# Patient Record
Sex: Female | Born: 1995 | Race: Black or African American | Hispanic: No | Marital: Married | Smoking: Never smoker
Health system: Southern US, Community
[De-identification: ages and names within clinical notes are randomized; demographics above are authoritative.]

## PROBLEM LIST (undated history)

## (undated) HISTORY — PX: TONSILLECTOMY: SUR1361

## (undated) HISTORY — PX: OTHER SURGICAL HISTORY: SHX169

---

## 2016-05-05 ENCOUNTER — Emergency Department (HOSPITAL_COMMUNITY)
Admission: EM | Admit: 2016-05-05 | Discharge: 2016-05-06 | Disposition: A | Discharge status: Home / Self Care | Payer: Self-pay | Attending: Emergency Medicine | Admitting: Emergency Medicine

## 2016-05-05 ENCOUNTER — Encounter (HOSPITAL_COMMUNITY): Payer: Self-pay | Admitting: Emergency Medicine

## 2016-05-05 DIAGNOSIS — R42 Dizziness and giddiness: Secondary | ICD-10-CM | POA: Insufficient documentation

## 2016-05-05 DIAGNOSIS — F0781 Postconcussional syndrome: Secondary | ICD-10-CM | POA: Insufficient documentation

## 2016-05-05 LAB — POC URINE PREG, ED: PREG TEST UR: NEGATIVE

## 2016-05-05 NOTE — ED Notes (Signed)
Bed: WA04 Expected date:  Expected time:  Means of arrival:  Comments: EMS 20 yo female dizziness/nystagmus-syncopal episode yesterday

## 2016-05-05 NOTE — ED Triage Notes (Signed)
Pt reports dizziness and headache that has been intermittent since hitting head on dresser on 05/04/16. Pt alert and oriented x 4 at this time. Pt reports having brief LOC yesterday after incident unknown of exact length of time.

## 2016-05-06 ENCOUNTER — Emergency Department (HOSPITAL_COMMUNITY): Payer: Self-pay

## 2016-05-06 LAB — URINALYSIS, ROUTINE W REFLEX MICROSCOPIC
Bilirubin Urine: NEGATIVE
Glucose, UA: NEGATIVE mg/dL
Hgb urine dipstick: NEGATIVE
Ketones, ur: NEGATIVE mg/dL
LEUKOCYTES UA: NEGATIVE
NITRITE: NEGATIVE
PROTEIN: NEGATIVE mg/dL
SPECIFIC GRAVITY, URINE: 1.03 (ref 1.005–1.030)
pH: 6.5 (ref 5.0–8.0)

## 2016-05-06 MED ORDER — IBUPROFEN 800 MG PO TABS: 800.0000 mg | ORAL_TABLET | Freq: Three times a day (TID) | ORAL | 0 refills | Status: DC

## 2016-05-06 NOTE — ED Provider Notes (Signed)
[facility]-EMERGENCY DEPT Provider Note   CSN: [id number] Arrival date & time: 05/05/16  2335     History   Chief Complaint Chief Complaint  Patient presents with  . Dizziness    HPI Michelle Preston is a 20 y.o. female.  The history is provided by the patient.  Dizziness  Quality:  Lightheadedness Severity:  Moderate Onset quality:  Sudden Duration:  1 day Timing:  Constant Progression:  Unchanged Chronicity:  New Context: not with medication   Relieved by:  Nothing Worsened by:  Nothing Ineffective treatments:  None tried Associated symptoms: no tinnitus, no vision changes, no vomiting and no weakness   Risk factors: no hx of vertigo   had a fall yesterday and struck the back of her head on a dresser and has felt lightheaded ever since.  No weakness.  No changes in vision or speech.    History reviewed. No pertinent past medical history.  There are no active problems to display for this patient.   Past Surgical History:  Procedure Laterality Date  . adnoidectomy    . CESAREAN SECTION  10/24/2013  . TONSILLECTOMY      OB History    No data available       Home Medications    Prior to Admission medications   Not on File    Family History History reviewed. No pertinent family history.  Social History Social History  Substance Use Topics  . Smoking status: Never Smoker  . Smokeless tobacco: Never Used  . Alcohol use No     Allergies   Penicillins   Review of Systems Review of Systems  Constitutional: Negative for diaphoresis and fever.  HENT: Negative for tinnitus.   Eyes: Negative for photophobia.  Gastrointestinal: Negative for vomiting.  Neurological: Positive for dizziness and light-headedness. Negative for tremors, seizures, speech difficulty and weakness.  All other systems reviewed and are negative.    Physical Exam Updated Vital Signs BP 131/84 (BP Location: Left Arm)   Pulse 89   Temp 98.6 F (37 C) (Oral)   Resp 20   Ht 5'  2" (1.575 m)   Wt 206 lb (93.4 kg)   SpO2 99%   BMI 37.68 kg/m   Physical Exam  Constitutional: She is oriented to person, place, and time. She appears well-developed and well-nourished. No distress.  HENT:  Head: Normocephalic and atraumatic. Head is without raccoon's eyes and without Battle's sign.  Right Ear: External ear normal. No hemotympanum.  Left Ear: External ear normal. No hemotympanum.  Mouth/Throat: No oropharyngeal exudate.  Eyes: Conjunctivae and EOM are normal. Pupils are equal, round, and reactive to light.  Neck: Normal range of motion. Neck supple.  Cardiovascular: Normal rate, regular rhythm, normal heart sounds and intact distal pulses.   Pulmonary/Chest: Effort normal and breath sounds normal. No respiratory distress.  Abdominal: Soft. Bowel sounds are normal. She exhibits no mass. There is no tenderness. There is no rebound and no guarding.  Musculoskeletal: Normal range of motion.  Neurological: She is alert and oriented to person, place, and time. She has normal reflexes. No cranial nerve deficit. Coordination normal.  Skin: Skin is warm and dry. Capillary refill takes less than 2 seconds.  Psychiatric: She has a normal mood and affect.     ED Treatments / Results  Labs (all labs ordered are listed, but only abnormal results are displayed) Labs Reviewed  URINALYSIS, ROUTINE W REFLEX MICROSCOPIC (NOT AT [facility])  POC URINE PREG, ED    EKG  EKG Interpretation None       Radiology No results found.  Procedures Procedures (including critical care time)  Medications Ordered in ED Medications - No data to display   Initial Impression / Assessment and Plan / ED Course  I have reviewed the triage vital signs and the nursing notes.  Pertinent labs & imaging results that were available during my care of the patient were reviewed by me and considered in my medical decision making (see chart for details).  Clinical Course   Vitals:   05/05/16 2338    BP: 131/84  Pulse: 89  Resp: 20  Temp: 98.6 F (37 C)   Orthostatic VS for the past 24 hrs:  BP- Lying Pulse- Lying BP- Sitting Pulse- Sitting BP- Standing at 0 minutes Pulse- Standing at 0 minutes  05/06/16 0009 (!) 128/96 76 (!) 139/99 86 130/85 87        Final Clinical Impressions(s) / ED Diagnoses   Final diagnoses:  None   Post concussive syndrome.  Alternating tylenol and ibuprofen, no sports limit screen time to < 30 minutes a day.  Lots of water and good balanced diet.  All questions answered to patient's satisfaction. Based on history and exam patient has been appropriately medically screened and emergency conditions excluded. Patient is stable for discharge at this time. Follow up with your PMD for recheck in 2 days and strict return precautions given.  New Prescriptions New Prescriptions   No medications on file     Cathy Crounse, MD 05/06/16 0151

## 2017-05-06 IMAGING — CT CT HEAD W/O CM
3 of 4 series · 15 of 47 positions shown, 18 images · non-contrast
Comparison: None.

CLINICAL DATA: 20-year-old female with intermittent headache and
dizziness.

EXAM:
CT HEAD WITHOUT CONTRAST
TECHNIQUE: Contiguous axial images were obtained from the base of the skull
through the vertex without intravenous contrast.

[Series 2: head w/o · axial · non-contrast · 0.45mm/px · z∈[+922,+1032]mm · 9 of 28 slices shown, 12 images]
[im 3/28  brain]
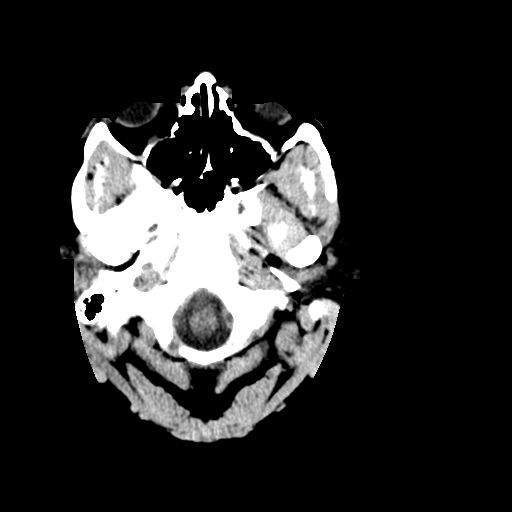
[im 3/28  bone]
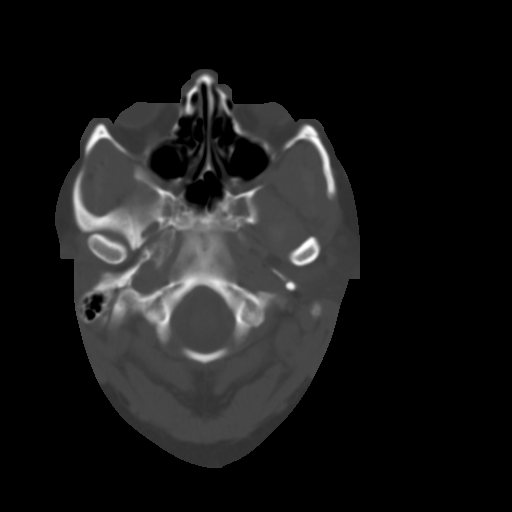
[im 6/28  brain]
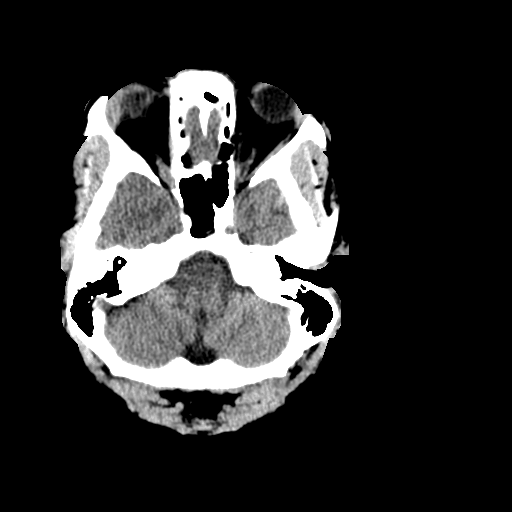
[im 9/28  brain]
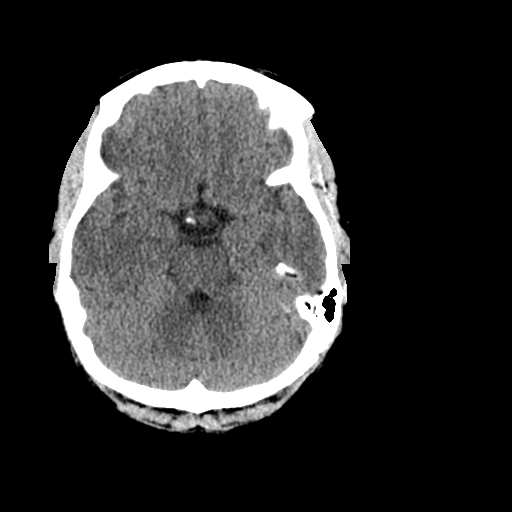
[im 11/28  brain]
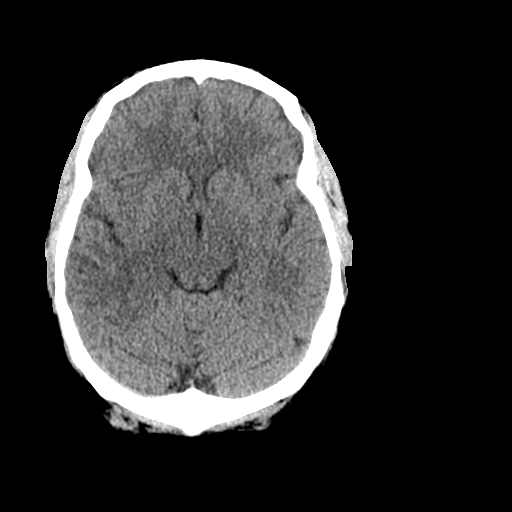
[im 14/28  brain]
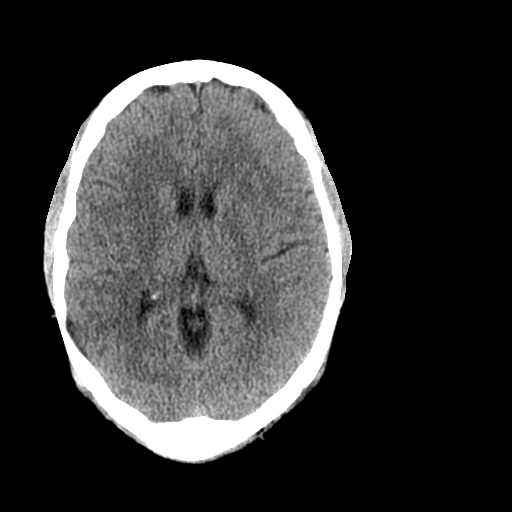
[im 14/28  bone]
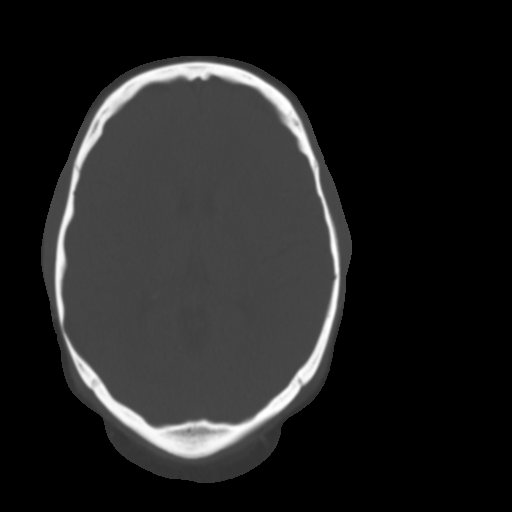
[im 17/28  brain]
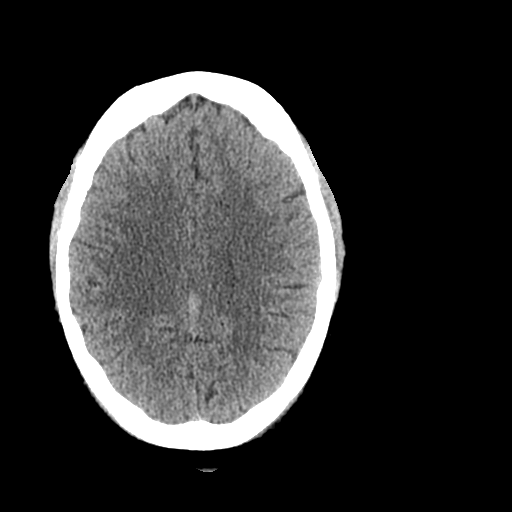
[im 19/28  brain]
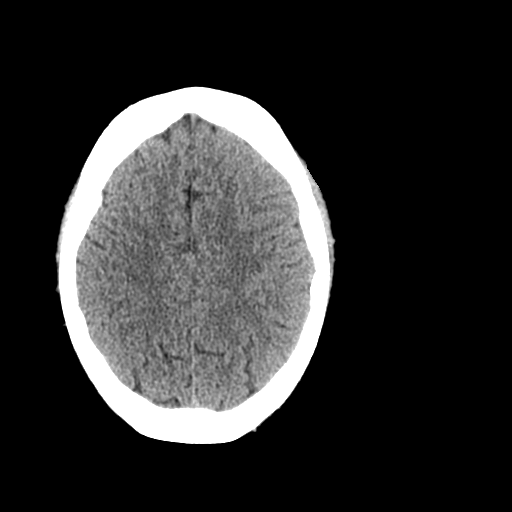
[im 22/28  brain]
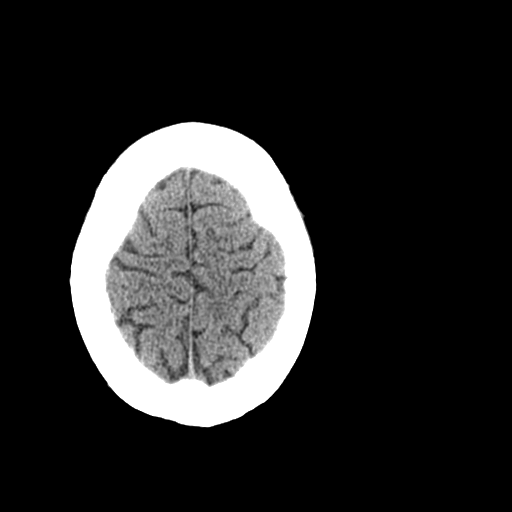
[im 25/28  brain]
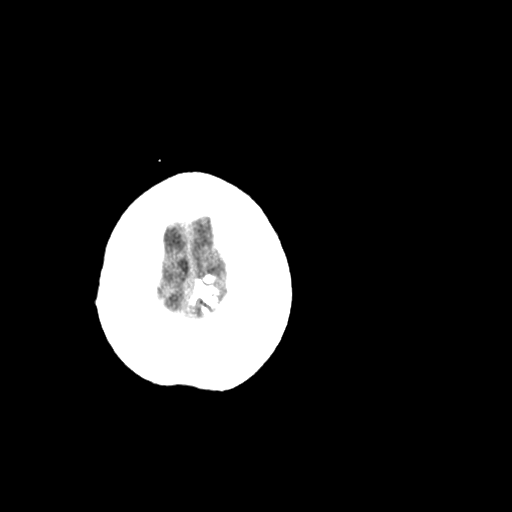
[im 25/28  bone]
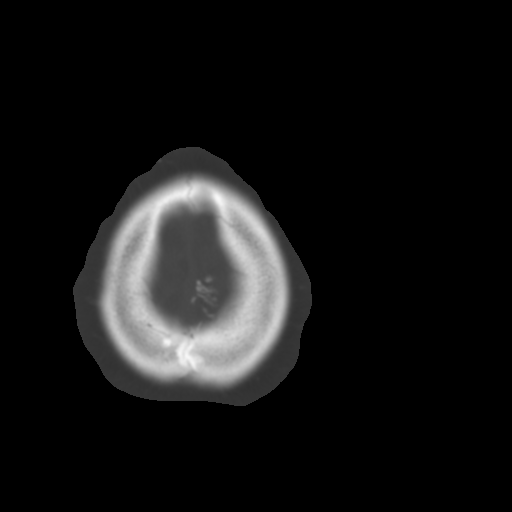

[Series 4: coronal · coronal · 0.26mm/px · 3 of 57 slices shown]
[im 19/57  brain]
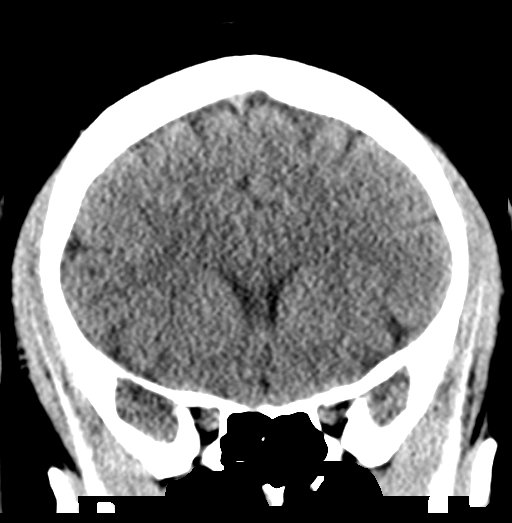
[im 25/57  brain]
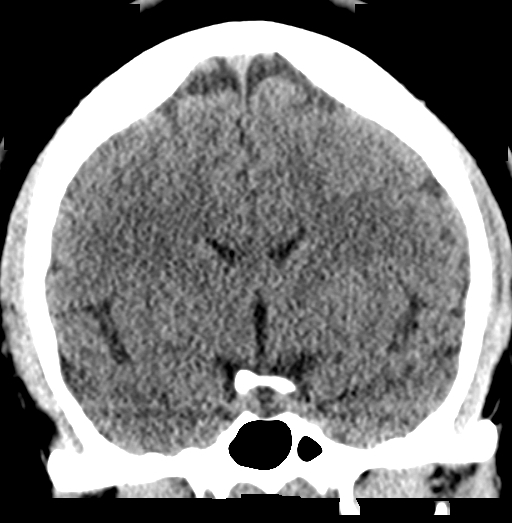
[im 32/57  brain]
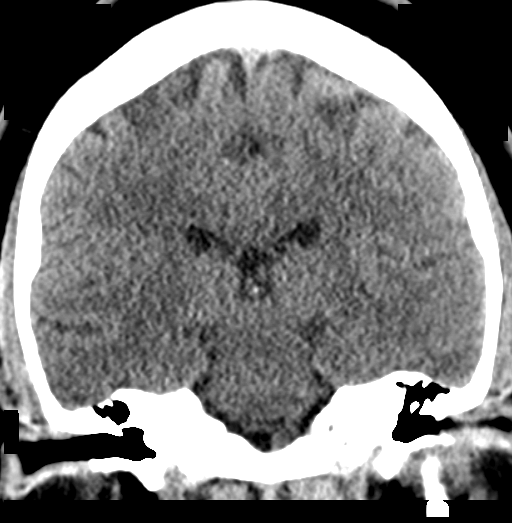

[Series 5: sagittal · sagittal · 0.27mm/px · 3 of 46 slices shown]
[im 16/46  brain]
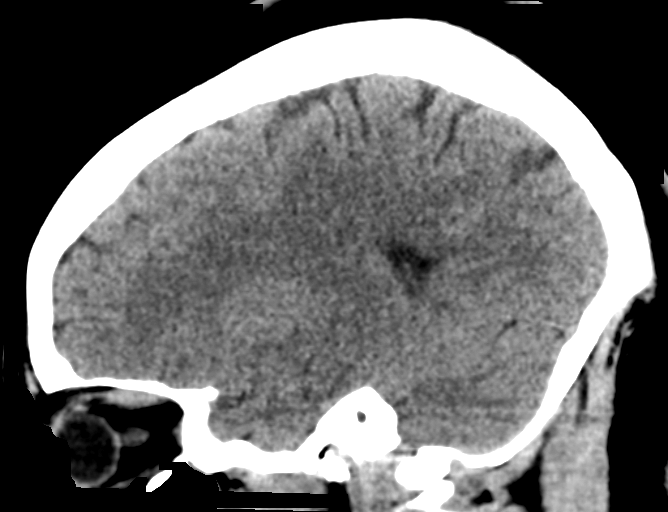
[im 23/46  brain]
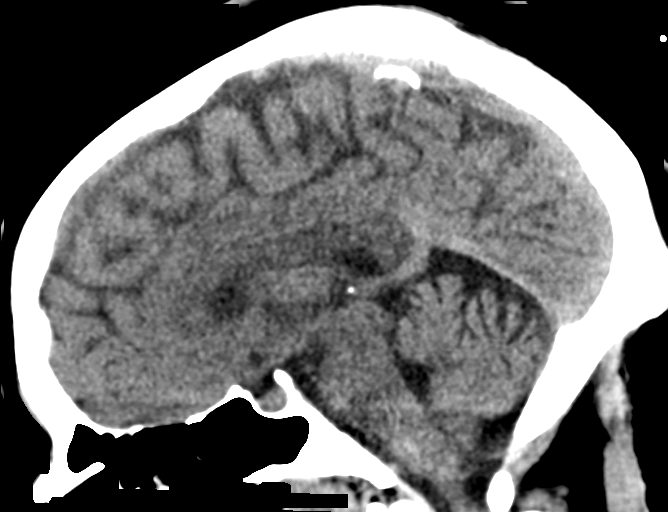
[im 31/46  brain]
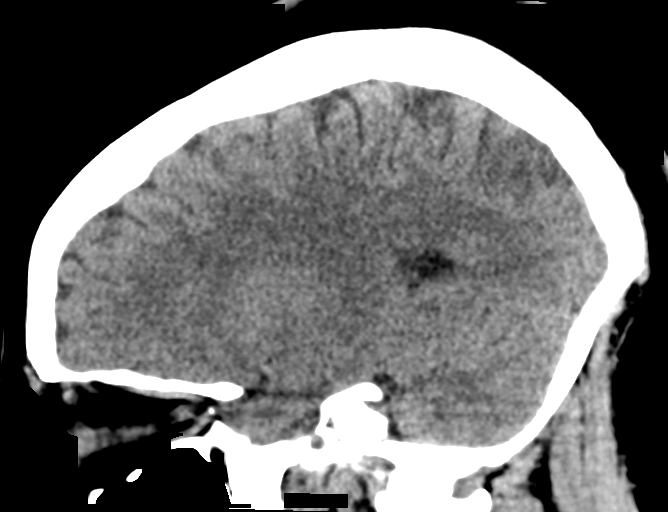

[15 of 47 positions shown; findings below may reference images not displayed]

FINDINGS: The ventricles and the sulci are appropriate in size for the
patient's age. There is no intracranial hemorrhage. No midline shift
or mass effect identified. The gray-white matter differentiation is
preserved.

The visualized paranasal sinuses and mastoid air cells are well
aerated. The calvarium is intact.
IMPRESSION: No acute intracranial pathology.

## 2021-01-02 ENCOUNTER — Other Ambulatory Visit: Payer: Self-pay

## 2021-01-02 ENCOUNTER — Encounter (HOSPITAL_COMMUNITY): Payer: Self-pay

## 2021-01-02 ENCOUNTER — Emergency Department (HOSPITAL_COMMUNITY)
Admission: EM | Admit: 2021-01-02 | Discharge: 2021-01-02 | Disposition: A | Discharge status: Home / Self Care | Payer: Self-pay | Attending: Emergency Medicine | Admitting: Emergency Medicine

## 2021-01-02 DIAGNOSIS — M5431 Sciatica, right side: Secondary | ICD-10-CM

## 2021-01-02 DIAGNOSIS — M5441 Lumbago with sciatica, right side: Secondary | ICD-10-CM | POA: Insufficient documentation

## 2021-01-02 MED ORDER — IBUPROFEN 800 MG PO TABS: 800.0000 mg | ORAL_TABLET | Freq: Three times a day (TID) | ORAL | 0 refills | Status: AC | PRN

## 2021-01-02 MED ORDER — CYCLOBENZAPRINE HCL 10 MG PO TABS: 10.0000 mg | ORAL_TABLET | Freq: Two times a day (BID) | ORAL | 0 refills | Status: AC | PRN

## 2021-01-02 MED ORDER — METHYLPREDNISOLONE 4 MG PO TBPK: ORAL_TABLET | ORAL | 0 refills | Status: AC

## 2021-01-02 NOTE — ED Provider Notes (Signed)
Emergency Medicine Provider Triage Evaluation Note  Michelle Preston , a 25 y.o. female  was evaluated in triage.  Pt complains of acute on chronic right sided back pain down her right leg.  paim for 2 years.  This flair started 2 days ago.  No specific injury.  Review of Systems  Positive: Back pain, leg pain Negative: Fever, abdominal pain, urinary symptoms  Physical Exam  BP (!) 144/85   Pulse 98   Temp 98.6 F (37 C) (Oral)   Resp 16   Ht 5\' 1"  (1.549 m)   Wt 108.9 kg   SpO2 99%   BMI 45.35 kg/m  Gen:   Awake, no distress   Resp:  Normal effort  MSK:   Moves extremities without difficulty  Other:  Patient able to lift left leg with out difficulty  Medical Decision Making  Medically screening exam initiated at 3:05 PM.  Appropriate orders placed.  Michelle Preston was informed that the remainder of the evaluation will be completed by another provider, this initial triage assessment does not replace that evaluation, and the importance of remaining in the ED until their evaluation is complete.     Michelle Preston, Michelle Preston 01/02/21 1506    Michelle Preston, 03/04/21, MD 01/05/21 [phone]

## 2021-01-02 NOTE — Discharge Instructions (Signed)
Recommend 800 mg of ibuprofen every 8 hours for the next 5 days and then as needed.  Recommend 1000 mg of Tylenol every 6 hours for the next 5 days and then as needed.  Take Flexeril as prescribed.  Follow Medrol Dosepak instructions.  Follow-up with sports medicine.

## 2021-01-02 NOTE — ED Provider Notes (Signed)
[facility] EMERGENCY DEPARTMENT Provider Note   CSN: [id number] Arrival date & time: 01/02/21  1408     History Chief Complaint  Patient presents with  . Back Pain    Michelle Preston is a 25 y.o. female.  The history is provided by the patient.  Back Pain Location:  Gluteal region Quality:  Aching Pain severity:  Moderate Onset quality:  Gradual Timing:  Intermittent Progression:  Waxing and waning Chronicity:  Recurrent Context: physical stress   Relieved by:  Nothing Worsened by:  Palpation and ambulation Associated symptoms: no abdominal pain, no abdominal swelling, no bladder incontinence, no bowel incontinence, no chest pain, no dysuria, no fever, no headaches, no leg pain, no numbness, no paresthesias, no pelvic pain, no perianal numbness, no tingling, no weakness and no weight loss   Risk factors: not pregnant (patient has no concern for pregnancy)        History reviewed. No pertinent past medical history.  There are no problems to display for this patient.   Past Surgical History:  Procedure Laterality Date  . adnoidectomy    . CESAREAN SECTION  10/24/2013  . TONSILLECTOMY       OB History   No obstetric history on file.     History reviewed. No pertinent family history.  Social History   Tobacco Use  . Smoking status: Never Smoker  . Smokeless tobacco: Never Used  Substance Use Topics  . Alcohol use: No  . Drug use: No    Home Medications Prior to Admission medications   Medication Sig Start Date End Date Taking? Authorizing Provider  cyclobenzaprine (FLEXERIL) 10 MG tablet Take 1 tablet (10 mg total) by mouth 2 (two) times daily as needed for muscle spasms. 01/02/21  Yes Dionta Larke, DO  methylPREDNISolone (MEDROL DOSEPAK) 4 MG TBPK tablet Follow package insert 01/02/21  Yes Letia Guidry, DO  ibuprofen (ADVIL) 800 MG tablet Take 1 tablet (800 mg total) by mouth every 8 (eight) hours as needed for up to 30 doses. 01/02/21    Veniamin Kincaid, DO    Allergies    Penicillins  Review of Systems   Review of Systems  Constitutional: Negative for chills, fever and weight loss.  HENT: Negative for ear pain and sore throat.   Eyes: Negative for pain and visual disturbance.  Respiratory: Negative for cough and shortness of breath.   Cardiovascular: Negative for chest pain and palpitations.  Gastrointestinal: Negative for abdominal pain, bowel incontinence and vomiting.  Genitourinary: Negative for bladder incontinence, dysuria, hematuria and pelvic pain.  Musculoskeletal: Positive for arthralgias and back pain.  Skin: Negative for color change and rash.  Neurological: Negative for tingling, seizures, syncope, weakness, numbness, headaches and paresthesias.  All other systems reviewed and are negative.   Physical Exam Updated Vital Signs BP (!) 144/85   Pulse 98   Temp 98.6 F (37 C) (Oral)   Resp 16   Ht 5\' 1"  (1.549 m)   Wt 108.9 kg   SpO2 99%   BMI 45.35 kg/m   Physical Exam Vitals and nursing note reviewed.  Constitutional:      General: She is not in acute distress.    Appearance: She is well-developed.  Cardiovascular:     Pulses: Normal pulses.  Abdominal:     Palpations: Abdomen is soft.     Tenderness: There is no abdominal tenderness.  Musculoskeletal:        General: Tenderness present. No swelling. Normal range of motion.  Comments: Tenderness to the right gluteal area  Skin:    General: Skin is warm and dry.  Neurological:     General: No focal deficit present.     Mental Status: She is alert.     Sensory: No sensory deficit.     Motor: No weakness.     ED Results / Procedures / Treatments   Labs (all labs ordered are listed, but only abnormal results are displayed) Labs Reviewed - No data to display  EKG None  Radiology No results found.  Procedures Procedures   Medications Ordered in ED Medications - No data to display  ED Course  I have reviewed the  triage vital signs and the nursing notes.  Pertinent labs & imaging results that were available during my care of the patient were reviewed by me and considered in my medical decision making (see chart for details).    MDM Rules/Calculators/A&P                          Michelle Preston is here with lower back/right gluteal pain.  History of the same.  Sciatic nerve irritation sounding story.  Patient has no concern for pregnancy.  Recent menstrual cycle.  No abdominal pain.  No midline spinal pain or severe low back pain.  Pain mostly in the right gluteal area.  Works [employer] job.  No symptoms of cauda equina.  Will prescribe Flexeril, Motrin, Medrol Dosepak.  Recommend Tylenol as well.  Discharged in good condition.  Will give information to follow-up with sports medicine.  This chart was dictated using voice recognition software.  Despite best efforts to proofread,  errors can occur which can change the documentation meaning.   Final Clinical Impression(s) / ED Diagnoses Final diagnoses:  Sciatica of right side    Rx / DC Orders ED Discharge Orders         Ordered    methylPREDNISolone (MEDROL DOSEPAK) 4 MG TBPK tablet        01/02/21 1748    cyclobenzaprine (FLEXERIL) 10 MG tablet  2 times daily PRN        01/02/21 1748    ibuprofen (ADVIL) 800 MG tablet  Every 8 hours PRN        01/02/21 1748           Virgina Norfolk, DO 01/02/21 1748

## 2021-01-02 NOTE — ED Notes (Signed)
Patient given discharge paperwork and instructions. Verbalized understanding of teaching. No IV access. Ambulatory to exit in NAD with steady gait. 

## 2021-01-05 ENCOUNTER — Ambulatory Visit: Payer: Self-pay | Admitting: Family Medicine

## 2021-01-05 NOTE — Progress Notes (Deleted)
  Michelle Preston - 25 y.o. female MRN [id number]  Date of birth: 10/31/1995  SUBJECTIVE:  Including CC & ROS.  No chief complaint on file.   Michelle Preston is a 25 y.o. female that is  ***.  ***   Review of Systems See HPI   HISTORY: Past Medical, Surgical, Social, and Family History Reviewed & Updated per EMR.   Pertinent Historical Findings include:  No past medical history on file.  Past Surgical History:  Procedure Laterality Date  . adnoidectomy    . CESAREAN SECTION  10/24/2013  . TONSILLECTOMY      No family history on file.  Social History   Socioeconomic History  . Marital status: Married    Spouse name: Not on file  . Number of children: Not on file  . Years of education: Not on file  . Highest education level: Not on file  Occupational History  . Not on file  Tobacco Use  . Smoking status: Never Smoker  . Smokeless tobacco: Never Used  Substance and Sexual Activity  . Alcohol use: No  . Drug use: No  . Sexual activity: Not on file  Other Topics Concern  . Not on file  Social History Narrative  . Not on file   Social Determinants of Health   Financial Resource Strain: Not on file  Food Insecurity: Not on file  Transportation Needs: Not on file  Physical Activity: Not on file  Stress: Not on file  Social Connections: Not on file  Intimate Partner Violence: Not on file     PHYSICAL EXAM:  VS: There were no vitals taken for this visit. Physical Exam Gen: NAD, alert, cooperative with exam, well-appearing MSK:  ***      ASSESSMENT & PLAN:   No problem-specific Assessment & Plan notes found for this encounter.

## 2021-01-25 ENCOUNTER — Ambulatory Visit: Payer: Self-pay | Admitting: Family Medicine

## 2021-01-25 NOTE — Progress Notes (Deleted)
  Michelle Preston - 25 y.o. female MRN [id number]  Date of birth: 1995-10-08  SUBJECTIVE:  Including CC & ROS.  No chief complaint on file.   Michelle Preston is a 25 y.o. female that is  ***.  ***   Review of Systems See HPI   HISTORY: Past Medical, Surgical, Social, and Family History Reviewed & Updated per EMR.   Pertinent Historical Findings include:  No past medical history on file.  Past Surgical History:  Procedure Laterality Date  . adnoidectomy    . CESAREAN SECTION  10/24/2013  . TONSILLECTOMY      No family history on file.  Social History   Socioeconomic History  . Marital status: Married    Spouse name: Not on file  . Number of children: Not on file  . Years of education: Not on file  . Highest education level: Not on file  Occupational History  . Not on file  Tobacco Use  . Smoking status: Never Smoker  . Smokeless tobacco: Never Used  Substance and Sexual Activity  . Alcohol use: No  . Drug use: No  . Sexual activity: Not on file  Other Topics Concern  . Not on file  Social History Narrative  . Not on file   Social Determinants of Health   Financial Resource Strain: Not on file  Food Insecurity: Not on file  Transportation Needs: Not on file  Physical Activity: Not on file  Stress: Not on file  Social Connections: Not on file  Intimate Partner Violence: Not on file     PHYSICAL EXAM:  VS: There were no vitals taken for this visit. Physical Exam Gen: NAD, alert, cooperative with exam, well-appearing MSK:  ***      ASSESSMENT & PLAN:   No problem-specific Assessment & Plan notes found for this encounter.

## 2022-12-18 ENCOUNTER — Encounter: Payer: Self-pay | Admitting: *Deleted
